# Patient Record
Sex: Male | Born: 1990 | Race: White | Hispanic: No | Marital: Single | Smoking: Former smoker
Health system: Southern US, Community
[De-identification: ages and names within clinical notes are randomized; demographics above are authoritative.]

---
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
## 2014-02-04 ENCOUNTER — Emergency Department (HOSPITAL_COMMUNITY)
Admission: EM | Admit: 2014-02-04 | Discharge: 2014-02-04 | Disposition: A | Discharge status: Home / Self Care | Payer: Managed Care, Other (non HMO) | Attending: Emergency Medicine | Admitting: Emergency Medicine

## 2014-02-04 ENCOUNTER — Encounter (HOSPITAL_COMMUNITY): Payer: Self-pay | Admitting: Emergency Medicine

## 2014-02-04 DIAGNOSIS — R519 Headache, unspecified: Secondary | ICD-10-CM

## 2014-02-04 DIAGNOSIS — R51 Headache: Secondary | ICD-10-CM | POA: Insufficient documentation

## 2014-02-04 DIAGNOSIS — F17200 Nicotine dependence, unspecified, uncomplicated: Secondary | ICD-10-CM | POA: Insufficient documentation

## 2014-02-04 NOTE — ED Notes (Signed)
Pt c/o sharp intermittent pain to right temple x 3 days.

## 2014-02-04 NOTE — Discharge Instructions (Signed)
Take advil (ibuprofen) EITHER 600 mg 4 times a day OR 800 mg 3 times a day. Recheck if you get a constant headache lasting more than 30 minutes or if you get changes in your eye sight, uncontrolled vomiting, numbness of weakness in your extremities or you seem worse with an problems listed on the head injury sheet. . Once you are feeling better have your mothers doctor recheck your blood pressure.

## 2014-02-04 NOTE — ED Provider Notes (Signed)
CSN: [id number]     Arrival date & time 02/04/14  1636 History  This chart was scribed for Ward GivensIva L Daymon Hora, MD by Ardelia Memsylan Malpass, ED Scribe. This patient was seen in room [facility]16A/[facility]16A and the patient's care was started at 8:47 PM.   Chief Complaint  Patient presents with  . Headache    The history is provided by the patient and a parent. No language interpreter was used.    HPI Comments: Andrew Bowers is a 23 y.o. male who presents to the Emergency Department complaining of intermittent, sharp, jabbing pains over his right temple over the past 3 days. Pt states that the episodes occur 1-2 times every 10 minutes and only last a second.  Pt states that head/scalp is not sore to touch, but he states that movement of head worsens headache. He however can sleep laying on his right side.  He denies any head injuries to have onset his pain. 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 He states that he took 2 Advil pills, which helped ease the pain for about an hour. Mother states they have gas hear but  there is a carbon monoxide detector in the home, which hasn't gone off and that no one else in the home has had a headache. Pt denies fever, nausea, vomiting, change in vision, tingling/numbness in arms or legs or neck pain. Pt smokes about 1 pack a day, sometimes less. He denies drinking. He admits to being stressed from work (3rd shift) recently, as he had had increased duties (states they are doing the duties of 2 workers b/o increased orders.  MOP has his of headaches related to BP and sinuses.   PCP- None   History reviewed. No pertinent past medical history. History reviewed. No pertinent past surgical history. No family history on file. History  Substance Use Topics  . Smoking status: Current Every Day Smoker -- 1.00 packs/day    Types: Cigarettes  . Smokeless tobacco: Not on file  . Alcohol Use: No  smoke 1 pack/day or less, denies drinking Pt works in a  factory  Review of Systems  Eyes: Negative for visual disturbance.  Gastrointestinal: Negative for nausea and vomiting.  Musculoskeletal: Negative for neck pain.  Neurological: Positive for headaches. Negative for numbness.  All other systems reviewed and are negative.   Allergies  Review of patient's allergies indicates no known allergies.  Home Medications   Current Outpatient Rx  Name  Route  Sig  Dispense  Refill  . ibuprofen (ADVIL,MOTRIN) 200 MG tablet   Oral   Take 600 mg by mouth every 6 (six) hours as needed for headache.          Triage Vitals: BP 148/93  Pulse 82  Temp(Src) 98.8 F (37.1 C)  Resp 18  Ht 5\' 9"  (1.753 m)  Wt 185 lb 1 oz (83.944 kg)  BMI 27.32 kg/m2  SpO2 98%  Vital signs normal    Physical Exam  Nursing note and vitals reviewed. Constitutional: He is oriented to person, place, and time. He appears well-developed and well-nourished.  Non-toxic appearance. He does not appear ill. No distress.  HENT:  Head: Normocephalic and atraumatic.  Right Ear: External ear normal.  Left Ear: External ear normal.  Nose: Nose normal. No mucosal edema or rhinorrhea.  Mouth/Throat: Oropharynx is clear and moist and mucous membranes are normal. No dental abscesses or uvula swelling.  Scalp non-tender.  Eyes: Conjunctivae and  EOM are normal. Pupils are equal, round, and reactive to light.  Neck: Normal range of motion and full passive range of motion without pain. Neck supple.  Neck non-tender.  Cardiovascular: Normal rate, regular rhythm and normal heart sounds.  Exam reveals no gallop and no friction rub.   No murmur heard. Pulmonary/Chest: Effort normal and breath sounds normal. No respiratory distress. He has no wheezes. He has no rhonchi. He has no rales. He exhibits no tenderness and no crepitus.  Abdominal: Soft. Normal appearance and bowel sounds are normal. He exhibits no distension. There is no tenderness. There is no rebound and no guarding.   Musculoskeletal: Normal range of motion. He exhibits no edema and no tenderness.  Moves all extremities well.   Neurological: He is alert and oriented to person, place, and time. He has normal strength. No cranial nerve deficit.  Grips equal. Motor strength intact. No loss of radial, medial or ulnar nerves.  Skin: Skin is warm, dry and intact. No rash noted. No erythema. No pallor.  Psychiatric: He has a normal mood and affect. His speech is normal and behavior is normal. His mood appears not anxious.    ED Course  Procedures (including critical care time)  DIAGNOSTIC STUDIES: Oxygen Saturation is 98% on RA, normal by my interpretation.    COORDINATION OF CARE: 9:20 PM- Advised pt to Increase Advil dosage. Discussed return precautions. Pt advised of plan for treatment and pt agrees. We discussed to be rechecked if is has a constant persistent headache.  Labs Review Labs Reviewed - No data to display Imaging Review No results found.  EKG Interpretation   None       MDM   Final diagnoses:  Right temporal headache    Plan discharge   Devoria Albe, MD, FACEP   I personally performed the services described in this documentation, which was scribed in my presence. The recorded information has been reviewed and considered.  Devoria Albe, MD, FACEP   Ward Givens, MD 02/05/14 [phone]

## 2017-03-29 ENCOUNTER — Encounter (HOSPITAL_COMMUNITY): Payer: Self-pay

## 2017-03-29 ENCOUNTER — Emergency Department (HOSPITAL_COMMUNITY)
Admission: EM | Admit: 2017-03-29 | Discharge: 2017-03-29 | Disposition: A | Discharge status: Home / Self Care | Payer: BLUE CROSS/BLUE SHIELD | Attending: Emergency Medicine | Admitting: Emergency Medicine

## 2017-03-29 DIAGNOSIS — Z711 Person with feared health complaint in whom no diagnosis is made: Secondary | ICD-10-CM

## 2017-03-29 DIAGNOSIS — Z87891 Personal history of nicotine dependence: Secondary | ICD-10-CM | POA: Diagnosis not present

## 2017-03-29 DIAGNOSIS — Z202 Contact with and (suspected) exposure to infections with a predominantly sexual mode of transmission: Secondary | ICD-10-CM | POA: Insufficient documentation

## 2017-03-29 MED ORDER — CEFTRIAXONE SODIUM 250 MG IJ SOLR
250.0000 mg | Freq: Once | INTRAMUSCULAR | Status: AC
  Administered 2017-03-29: 250 mg via INTRAMUSCULAR
  Filled 2017-03-29: qty 250

## 2017-03-29 MED ORDER — LIDOCAINE HCL (PF) 1 % IJ SOLN
INTRAMUSCULAR | Status: AC
  Administered 2017-03-29: 0.9 mL
  Filled 2017-03-29: qty 5

## 2017-03-29 MED ORDER — AZITHROMYCIN 250 MG PO TABS
1000.0000 mg | ORAL_TABLET | Freq: Once | ORAL | Status: AC
  Administered 2017-03-29: 1000 mg via ORAL
  Filled 2017-03-29: qty 4

## 2017-03-29 NOTE — ED Triage Notes (Addendum)
Pts girlfriend was notified today that she has chlamydia . Pt told girlfriend that he had been cheating on her with a girl from dating site. Denies any discharge or burning with urination

## 2017-03-29 NOTE — ED Notes (Signed)
Pt states understanding of d/c instructions and follow up care. Denies further questions at this time.

## 2017-03-29 NOTE — ED Provider Notes (Signed)
[facility]PT Provider Note   CSN: [id number] Arrival date & time: 03/29/17  1724     History   Chief Complaint Chief Complaint  Patient presents with  . SEXUALLY TRANSMITTED DISEASE    HPI Andrew Bowers is a 26 y.o. male.  Andrew Bowers is a 26 y.o. Male who presents to the ED with concern about exposure to STD. Patient reports his girlfriend was recently diagnosed with chlamydia. He does admit to cheating on the girlfriend recently. He denies any symptoms of an STD. He denies any history of previous STDs. He admits to having unprotected intercourse. He denies penile pain, penile discharge, penile itching, fevers, testicular pain, abdominal pain, nausea, vomiting or rashes.   The history is provided by the patient and medical records. No language interpreter was used.    History reviewed. No pertinent past medical history.  There are no active problems to display for this patient.   History reviewed. No pertinent surgical history.     Home Medications    Prior to Admission medications   Medication Sig Start Date End Date Taking? Authorizing Provider  ibuprofen (ADVIL,MOTRIN) 200 MG tablet Take 600 mg by mouth every 6 (six) hours as needed for headache.    Historical Provider, MD    Family History No family history on file.  Social History Social History  Substance Use Topics  . Smoking status: Former Smoker    Packs/day: 1.00    Types: Cigarettes  . Smokeless tobacco: Never Used  . Alcohol use No     Allergies   Patient has no known allergies.   Review of Systems Review of Systems  Constitutional: Negative for fever.  HENT: Negative for mouth sores.   Gastrointestinal: Negative for abdominal pain, nausea and vomiting.  Genitourinary: Negative for discharge, dysuria, flank pain, frequency, genital sores, penile pain, testicular pain and urgency.  Skin: Negative for rash.     Physical Exam Updated Vital Signs BP (!) 163/92 (BP Location: Right  Arm)   Pulse 94   Temp 97.9 F (36.6 C) (Oral)   Resp 18   Ht  (1.778 m)   Wt 81.6 kg   SpO2 100%   BMI 25.83 kg/m   Physical Exam  Constitutional: He appears well-developed and well-nourished. No distress.  HENT:  Head: Normocephalic and atraumatic.  Mouth/Throat: Oropharynx is clear and moist.  Eyes: Right eye exhibits no discharge. Left eye exhibits no discharge.  Cardiovascular: Normal rate, regular rhythm and intact distal pulses.   Pulmonary/Chest: Effort normal. No respiratory distress.  Abdominal: Soft. There is no tenderness. There is no guarding.  Genitourinary: Penis normal. No penile tenderness.  Genitourinary Comments: GU exam with male RN chaperone. Penis is circumcised. No penile discharge. No testicular or penile tenderness to palpation. No GU rashes.  Neurological: He is alert. Coordination normal.  Skin: Skin is warm and dry. Capillary refill takes less than 2 seconds. No rash noted. He is not diaphoretic. No erythema. No pallor.  Psychiatric: He has a normal mood and affect. His behavior is normal.  Nursing note and vitals reviewed.    ED Treatments / Results  Labs (all labs ordered are listed, but only abnormal results are displayed) Labs Reviewed  GC/CHLAMYDIA PROBE AMP ([place]) NOT AT [facility]    EKG  EKG Interpretation None       Radiology No results found.  Procedures Procedures (including critical care time)  Medications Ordered in ED Medications  cefTRIAXone (ROCEPHIN) injection 250 mg (250  mg Intramuscular Given 03/29/17 1815)  azithromycin (ZITHROMAX) tablet 1,000 mg (1,000 mg Oral Given 03/29/17 1814)  lidocaine (PF) (XYLOCAINE) 1 % injection (0.9 mLs  Given 03/29/17 1815)     Initial Impression / Assessment and Plan / ED Course  I have reviewed the triage vital signs and the nursing notes.  Pertinent labs & imaging results that were available during my care of the patient were reviewed by me and considered in my medical  decision making (see chart for details).     This is a 26 y.o. Male who presents to the ED with concern about exposure to STD. Patient reports his girlfriend was recently diagnosed with chlamydia. He does admit to cheating on the girlfriend recently. He denies any symptoms of an STD. He denies any history of previous STDs. He admits to having unprotected intercourse.  On exam patient is afebrile nontoxic appearing. His abdomen is soft and nontender to palpation. No GU rashes noted. No penile discharge noted. Gonorrhea and Chlamydia testing pending. Patient reports he will decline HIV and syphilis testing at this time and will do this at the health department. We'll treat for gonorrhea and chlamydia here today with Rocephin and azithromycin. I discussed safe sex practices with the patient. I encouraged him to follow up with health department. I advised the patient to follow-up with their primary care provider this week. I advised the patient to return to the emergency department with new or worsening symptoms or new concerns. The patient verbalized understanding and agreement with plan.      Final Clinical Impressions(s) / ED Diagnoses   Final diagnoses:  Exposure to sexually transmitted disease (STD)  Concern about STD in male without diagnosis    New Prescriptions New Prescriptions   No medications on file     Everlene Farrier, PA-C 03/29/17 1825    Eber Hong, MD 03/31/17 [phone]

## 2017-03-30 LAB — GC/CHLAMYDIA PROBE AMP (~~LOC~~) NOT AT ARMC
CHLAMYDIA, DNA PROBE: NEGATIVE
NEISSERIA GONORRHEA: NEGATIVE

## 2017-09-06 ENCOUNTER — Emergency Department (HOSPITAL_COMMUNITY): Payer: Self-pay

## 2017-09-06 ENCOUNTER — Encounter (HOSPITAL_COMMUNITY): Payer: Self-pay | Admitting: *Deleted

## 2017-09-06 ENCOUNTER — Emergency Department (HOSPITAL_COMMUNITY)
Admission: EM | Admit: 2017-09-06 | Discharge: 2017-09-06 | Disposition: A | Discharge status: Home / Self Care | Payer: Self-pay | Attending: Emergency Medicine | Admitting: Emergency Medicine

## 2017-09-06 DIAGNOSIS — Z87891 Personal history of nicotine dependence: Secondary | ICD-10-CM | POA: Insufficient documentation

## 2017-09-06 DIAGNOSIS — R1084 Generalized abdominal pain: Secondary | ICD-10-CM | POA: Insufficient documentation

## 2017-09-06 DIAGNOSIS — R112 Nausea with vomiting, unspecified: Secondary | ICD-10-CM | POA: Insufficient documentation

## 2017-09-06 DIAGNOSIS — R197 Diarrhea, unspecified: Secondary | ICD-10-CM | POA: Insufficient documentation

## 2017-09-06 LAB — COMPREHENSIVE METABOLIC PANEL
ALT: 33 U/L (ref 17–63)
AST: 19 U/L (ref 15–41)
Albumin: 4.9 g/dL (ref 3.5–5.0)
Alkaline Phosphatase: 43 U/L (ref 38–126)
Anion gap: 12 (ref 5–15)
BUN: 14 mg/dL (ref 6–20)
CO2: 26 mmol/L (ref 22–32)
Calcium: 10 mg/dL (ref 8.9–10.3)
Chloride: 102 mmol/L (ref 101–111)
Creatinine, Ser: 1.07 mg/dL (ref 0.61–1.24)
GFR calc Af Amer: >60 mL/min (ref >60)
GFR calc non Af Amer: >60 mL/min (ref >60)
Glucose, Bld: 106 mg/dL — ABNORMAL HIGH (ref 65–99)
Potassium: 3.8 mmol/L (ref 3.5–5.1)
Sodium: 140 mmol/L (ref 135–145)
Total Bilirubin: 1.2 mg/dL (ref 0.3–1.2)
Total Protein: 8.5 g/dL — ABNORMAL HIGH (ref 6.5–8.1)

## 2017-09-06 LAB — URINALYSIS, ROUTINE W REFLEX MICROSCOPIC
Bilirubin Urine: NEGATIVE
Glucose, UA: NEGATIVE mg/dL
Hgb urine dipstick: NEGATIVE
Ketones, ur: 80 mg/dL — AB
Leukocytes, UA: NEGATIVE
Nitrite: NEGATIVE
Protein, ur: NEGATIVE mg/dL
Specific Gravity, Urine: 1.026 (ref 1.005–1.030)
pH: 5 (ref 5.0–8.0)

## 2017-09-06 LAB — CBC
HCT: 46.7 % (ref 39.0–52.0)
Hemoglobin: 16.3 g/dL (ref 13.0–17.0)
MCH: 31 pg (ref 26.0–34.0)
MCHC: 34.9 g/dL (ref 30.0–36.0)
MCV: 89 fL (ref 78.0–100.0)
Platelets: 225 10*3/uL (ref 150–400)
RBC: 5.25 MIL/uL (ref 4.22–5.81)
RDW: 12.3 % (ref 11.5–15.5)
WBC: 17.3 10*3/uL — ABNORMAL HIGH (ref 4.0–10.5)

## 2017-09-06 LAB — LIPASE, BLOOD: Lipase: 21 U/L (ref 11–51)

## 2017-09-06 MED ORDER — IOPAMIDOL (ISOVUE-300) INJECTION 61%
100.0000 mL | Freq: Once | INTRAVENOUS | Status: AC | PRN
  Administered 2017-09-06: 100 mL via INTRAVENOUS

## 2017-09-06 MED ORDER — SODIUM CHLORIDE 0.9 % IV BOLUS (SEPSIS)
1000.0000 mL | Freq: Once | INTRAVENOUS | Status: AC
  Administered 2017-09-06: 1000 mL via INTRAVENOUS

## 2017-09-06 MED ORDER — LOPERAMIDE HCL 2 MG PO CAPS: 2.0000 mg | ORAL_CAPSULE | Freq: Four times a day (QID) | ORAL | 0 refills | Status: AC | PRN

## 2017-09-06 MED ORDER — ONDANSETRON 4 MG PO TBDP: ORAL_TABLET | ORAL | 0 refills | Status: AC

## 2017-09-06 NOTE — ED Triage Notes (Addendum)
Pt c/o mid abdominal pain, vomiting, diarrhea, fever x 2 weeks.   Pt also requesting STD check. Denies penile drainage at this time.

## 2017-09-06 NOTE — ED Provider Notes (Signed)
[facility] Provider Note   CSN: [id number] Arrival date & time: 09/06/17  1201     History   Chief Complaint Chief Complaint  Patient presents with  . Abdominal Pain    HPI Andrew Bowers is a 26 y.o. male.  Patient reports diffuse abdominal pain of 2-3 weeks duration, with frequent episodes of diarrhea and vomiting. No identified trigger.   The history is provided by the patient. No language interpreter was used.  Abdominal Pain   This is a new problem. The current episode started more than 1 week ago. The problem occurs daily. The problem has been gradually worsening. The pain is associated with an unknown factor. The pain is located in the generalized abdominal region. The pain is moderate. Associated symptoms include fever, diarrhea, nausea, vomiting and headaches.    History reviewed. No pertinent past medical history.  There are no active problems to display for this patient.   History reviewed. No pertinent surgical history.     Home Medications    Prior to Admission medications   Medication Sig Start Date End Date Taking? Authorizing Provider  ibuprofen (ADVIL,MOTRIN) 200 MG tablet Take 600 mg by mouth every 6 (six) hours as needed for headache.    [provider]    Family History No family history on file.  Social History Social History  Substance Use Topics  . Smoking status: Former Smoker    Packs/day: 1.00    Types: Cigarettes  . Smokeless tobacco: Never Used  . Alcohol use No     Allergies   Patient has no known allergies.   Review of Systems Review of Systems  Constitutional: Positive for fever.  Gastrointestinal: Positive for abdominal pain, diarrhea, nausea and vomiting.  Genitourinary: Positive for decreased urine volume and difficulty urinating. Negative for discharge and testicular pain.  Neurological: Positive for headaches.  All other systems reviewed and are negative.    Physical Exam Updated Vital  Signs BP (!) 157/96 (BP Location: Right Arm)   Pulse 90   Temp 98.5 F (36.9 C) (Oral)   Resp 16   Ht  (1.753 m)   Wt 76.7 kg (169 lb 1.6 oz)   SpO2 100%   BMI 24.97 kg/m   Physical Exam  Constitutional: He is oriented to person, place, and time. He appears well-developed and well-nourished.  HENT:  Head: Normocephalic.  Eyes: Conjunctivae are normal.  Neck: Neck supple.  Cardiovascular: Normal rate and regular rhythm.   Pulmonary/Chest: Effort normal and breath sounds normal.  Abdominal: Soft. He exhibits no distension. There is no rebound and no guarding. Hernia confirmed negative in the right inguinal area and confirmed negative in the left inguinal area.  Genitourinary: Testes normal and penis normal. Circumcised.  Musculoskeletal: He exhibits no edema or tenderness.  Neurological: He is alert and oriented to person, place, and time.  Skin: Skin is warm and dry.  Psychiatric: He has a normal mood and affect.  Nursing note and vitals reviewed.    ED Treatments / Results  Labs (all labs ordered are listed, but only abnormal results are displayed) Labs Reviewed  COMPREHENSIVE METABOLIC PANEL - Abnormal; Notable for the following:       Result Value   Glucose, Bld 106 (*)    Total Protein 8.5 (*)    All other components within normal limits  CBC - Abnormal; Notable for the following:    WBC 17.3 (*)    All other components within normal limits  URINALYSIS,  ROUTINE W REFLEX MICROSCOPIC - Abnormal; Notable for the following:    APPearance HAZY (*)    Ketones, ur 80 (*)    All other components within normal limits  LIPASE, BLOOD    EKG  EKG Interpretation None       Radiology Ct Abdomen Pelvis W Contrast  Result Date: 09/06/2017 CLINICAL DATA:  ISOVUE 300 100 ml @ 3.0 60 sec delay Pt c/o mid intermittent sharp abdominal pain, vomiting, diarrhea, fever x 2 weeks. EXAM: CT ABDOMEN AND PELVIS WITH CONTRAST TECHNIQUE: Multidetector CT imaging of the abdomen  and pelvis was performed using the standard protocol following bolus administration of intravenous contrast. CONTRAST:  ISOVUE-300 IOPAMIDOL (ISOVUE-300) INJECTION 61% COMPARISON:  05/20/2005 plain films FINDINGS: Lower chest: No acute abnormality. Hepatobiliary: No focal liver abnormality is seen. No gallstones, gallbladder wall thickening, or biliary dilatation. Pancreas: Unremarkable. No pancreatic ductal dilatation or surrounding inflammatory changes. Spleen: Normal in size without focal abnormality. Adrenals/Urinary Tract: Normal adrenal glands. There is symmetric enhancement of both kidneys. No evidence for urinary tract obstruction. No renal mass. Urinary bladder is normal in appearance. Stomach/Bowel: The stomach and small bowel loops are normal in appearance. The appendix is well seen and has a normal appearance. Loops of colon are normal in appearance. Vascular/Lymphatic: There is normal vascular opacification of the celiac axis, superior mesenteric artery, and inferior mesenteric artery. Normal appearance of the portal venous system and inferior vena cava. No significant retroperitoneal adenopathy. There are mildly prominent lymph nodes within the right lower quadrant mesenteric, largest measuring 1.7 x 1.0 cm on coronal image 42 of series 5. Reproductive: Prostate is unremarkable. Other: No free pelvic fluid. Anterior abdominal wall is unremarkable in appearance. Musculoskeletal: No acute or significant osseous findings. IMPRESSION: 1. Mildly prominent right lower quadrant mesenteric lymph nodes consistent with mesenteric adenitis. 2. Normal appearance of the appendix. 3. No bowel obstruction or urinary tract obstruction. Electronically Signed   By: Norva Pavlov M.D.   On: 09/06/2017 18:24    Procedures Procedures (including critical care time)  Medications Ordered in ED Medications - No data to display   Initial Impression / Assessment and Plan / ED Course  I have reviewed the  triage vital signs and the nursing notes.  Pertinent labs & imaging results that were available during my care of the patient were reviewed by me and considered in my medical decision making (see chart for details).  Clinical Course as of Sep 06 1637  Tue Sep 06, 2017  1516 WBC: (!) 17.3 [SF]    Clinical Course User Index [SF] Durward Fortes, [place]    Patient is nontoxic, nonseptic appearing, in no apparent distress.  Patient's pain and other symptoms adequately managed in emergency department.  Fluid bolus given.  Labs, imaging and vitals reviewed.  Patient does not meet the SIRS or Sepsis criteria.  On repeat exam patient does not have a surgical abdomen and there are no peritoneal signs.  No indication of appendicitis, bowel obstruction, bowel perforation, cholecystitis, diverticulitis. Patient discharged home with symptomatic treatment and given strict instructions for follow-up with their primary care physician.  I have also discussed reasons to return immediately to the ER.  Patient to collect stool sample for culture and return sample for testing. Patient expresses understanding and agrees with plan.    Final Clinical Impressions(s) / ED Diagnoses   Final diagnoses:  Generalized abdominal pain  Nausea vomiting and diarrhea    New Prescriptions Discharge Medication List as of 09/06/2017  7:02 PM    START taking these medications   Details  loperamide (IMODIUM) 2 MG capsule Take 1 capsule (2 mg total) by mouth 4 (four) times daily as needed for diarrhea or loose stools., Starting Tue 09/06/2017, Print    ondansetron (ZOFRAN ODT) 4 MG disintegrating tablet  ODT q4 hours prn nausea/vomit, Print         Felicie Morn, NP 09/06/17 2354    Raeford Razor, MD 09/09/17 1441

## 2017-09-07 ENCOUNTER — Other Ambulatory Visit (HOSPITAL_COMMUNITY)
Admission: RE | Admit: 2017-09-07 | Discharge: 2017-09-07 | Disposition: A | Discharge status: Home / Self Care | Payer: Self-pay | Source: Ambulatory Visit | Attending: Nurse Practitioner | Admitting: Nurse Practitioner

## 2017-09-07 LAB — C DIFFICILE QUICK SCREEN W PCR REFLEX
C DIFFICILE (CDIFF) TOXIN: POSITIVE — AB
C Diff antigen: POSITIVE — AB
C Diff interpretation: DETECTED

## 2017-09-07 NOTE — ED Notes (Signed)
CRITICAL VALUE ALERT  Critical Value:  c diff positive  Date & Time Notied:  09/07/2017  Provider Notified: Dr. Clayborne Dana  Orders Received/Actions taken: Prescription for Flagyl  po bid x 2 weeks called in to [facility] pharmacy.  Instructed pt to return here as needed and follow up with pcp.  Pt verbalized understanding of instructions.

## 2017-09-13 LAB — STOOL CULTURE: E COLI SHIGA TOXIN ASSAY: NEGATIVE

## 2017-09-13 LAB — STOOL CULTURE REFLEX - RSASHR

## 2017-09-13 LAB — STOOL CULTURE REFLEX - CMPCXR

## 2017-09-14 LAB — OVA + PARASITE EXAM

## 2017-09-14 LAB — O&P RESULT

## 2017-09-27 NOTE — Progress Notes (Signed)
Patient stool culture positive for c-diff. Results reviewed by Dr. Clayborne Dana, prescription for flagyl called in to patient's pharmacy.

## 2018-02-20 ENCOUNTER — Encounter: Payer: Self-pay | Admitting: Gastroenterology

## 2018-03-07 ENCOUNTER — Other Ambulatory Visit: Payer: Self-pay

## 2018-03-07 ENCOUNTER — Encounter (HOSPITAL_COMMUNITY): Payer: Self-pay | Admitting: Emergency Medicine

## 2018-03-07 ENCOUNTER — Emergency Department (HOSPITAL_COMMUNITY)
Admission: EM | Admit: 2018-03-07 | Discharge: 2018-03-07 | Disposition: A | Discharge status: Home / Self Care | Payer: Self-pay | Attending: Emergency Medicine | Admitting: Emergency Medicine

## 2018-03-07 DIAGNOSIS — L259 Unspecified contact dermatitis, unspecified cause: Secondary | ICD-10-CM

## 2018-03-07 DIAGNOSIS — L237 Allergic contact dermatitis due to plants, except food: Secondary | ICD-10-CM | POA: Insufficient documentation

## 2018-03-07 DIAGNOSIS — Z87891 Personal history of nicotine dependence: Secondary | ICD-10-CM | POA: Insufficient documentation

## 2018-03-07 DIAGNOSIS — R300 Dysuria: Secondary | ICD-10-CM | POA: Insufficient documentation

## 2018-03-07 LAB — URINALYSIS, ROUTINE W REFLEX MICROSCOPIC
BILIRUBIN URINE: NEGATIVE
Glucose, UA: NEGATIVE mg/dL
HGB URINE DIPSTICK: NEGATIVE
KETONES UR: NEGATIVE mg/dL
Leukocytes, UA: NEGATIVE
NITRITE: NEGATIVE
PROTEIN: NEGATIVE mg/dL
Specific Gravity, Urine: 1.018 (ref 1.005–1.030)
pH: 6 (ref 5.0–8.0)

## 2018-03-07 MED ORDER — FAMOTIDINE 20 MG PO TABS
20.0000 mg | ORAL_TABLET | Freq: Once | ORAL | Status: AC
  Administered 2018-03-07: 20 mg via ORAL
  Filled 2018-03-07: qty 1

## 2018-03-07 MED ORDER — DEXAMETHASONE SODIUM PHOSPHATE 10 MG/ML IJ SOLN
10.0000 mg | Freq: Once | INTRAMUSCULAR | Status: AC
  Administered 2018-03-07: 10 mg via INTRAMUSCULAR
  Filled 2018-03-07: qty 1

## 2018-03-07 MED ORDER — FAMOTIDINE 20 MG PO TABS: 20.0000 mg | ORAL_TABLET | Freq: Two times a day (BID) | ORAL | 0 refills | Status: AC

## 2018-03-07 MED ORDER — PREDNISONE 20 MG PO TABS: ORAL_TABLET | ORAL | 0 refills | Status: AC

## 2018-03-07 NOTE — ED Triage Notes (Signed)
Pt wants to be tested for stds due to having unprotected sex. He c/o burning pain during urination and rash to the left arm.

## 2018-03-07 NOTE — Discharge Instructions (Addendum)
Stay cool, heat will make the rash itch more. Take the medications as prescribed. You can also take benadryl 25-50 mg every 6 hrs as needed for itching (it may make your sleepy) OR try zyrtec OTC once a day for the itching. You can use calamine lotion on the rash.  You will be called if your STD tests are positive. USE CONDOMS!!!

## 2018-03-07 NOTE — ED Provider Notes (Signed)
[facility] Provider Note   CSN: [id number] Arrival date & time: 03/07/18  0041  Time seen 02:40 AM   History   Chief Complaint Chief Complaint  Patient presents with  . SEXUALLY TRANSMITTED DISEASE    HPI Levin ErpJoshua T Zavadil is a 27 y.o. male.  HPI patient states March 24 he was helping someone do yard work and they were removing a lot of vines, he states some of the findings had a fuzzy covering on them.  This morning he woke up and had a itchy rash mainly on his left upper extremity.  He denies any new detergents, foods, soaps, colognes.  Nothing else is new.  He states he took Benadryl without relief.  Patient also complains of dysuria for a month.  He states is not every time he urinates.  He states he works in a hot environment and sometimes he thinks he gets dehydrated.  He was seen at the urgent care and he was given doxycycline that he took for 10 days and a injection or shot.  He denies any penile drip.  He states he had a new sexual partner however that was 2 weeks ago, after he had the symptoms.  Patient wants to be tested for STDs.  PCP Patient, No Pcp Per      History reviewed. No pertinent past medical history.  There are no active problems to display for this patient.   History reviewed. No pertinent surgical history.      Home Medications    Prior to Admission medications   Medication Sig Start Date End Date Taking? Authorizing Provider  oxymetazoline (AFRIN) 0.05 % nasal spray Place 1 spray into both nostrils 2 (two) times daily as needed for congestion.   Yes [provider]  famotidine (PEPCID) 20 MG tablet Take 1 tablet (20 mg total) by mouth 2 (two) times daily. 03/07/18   Devoria AlbeKnapp, Tyneshia Stivers, MD  ibuprofen (ADVIL,MOTRIN) 200 MG tablet Take 800 mg by mouth every 6 (six) hours as needed for headache.     [provider]  loperamide (IMODIUM) 2 MG capsule Take 1 capsule (2 mg total) by mouth 4 (four) times daily as needed for  diarrhea or loose stools. 09/06/17   Felicie MornSmith, David, NP  ondansetron (ZOFRAN ODT) 4 MG disintegrating tablet 4mg  ODT q4 hours prn nausea/vomit 09/06/17   Felicie MornSmith, David, NP  predniSONE (DELTASONE) 20 MG tablet Take 3 po QD x 3d , then 2 po QD x 3d then 1 po QD x 3d 03/07/18   Devoria AlbeKnapp, Sohaib Vereen, MD    Family History No family history on file.  Social History Social History   Tobacco Use  . Smoking status: Former Smoker    Packs/day: 1.00    Types: Cigarettes  . Smokeless tobacco: Never Used  Substance Use Topics  . Alcohol use: No  . Drug use: Yes    Types: Marijuana    Comment: last used 09/05/17  employed  Allergies   Patient has no known allergies.   Review of Systems Review of Systems  All other systems reviewed and are negative.    Physical Exam Updated Vital Signs BP (!) 148/98   Pulse 77   Temp 98.2 F (36.8 C)   Resp 18   Ht 5\' 10"  (1.778 m)   Wt 75.3 kg (166 lb)   SpO2 99%   BMI 23.82 kg/m   Vital signs normal    Physical Exam  Constitutional: He is oriented to person, place, and  time. He appears well-developed and well-nourished.  HENT:  Head: Normocephalic and atraumatic.  Right Ear: External ear normal.  Left Ear: External ear normal.  Nose: Nose normal.  Eyes: Conjunctivae and EOM are normal.  Neck: Normal range of motion.  Cardiovascular: Normal rate.  Pulmonary/Chest: Effort normal. No respiratory distress.  Musculoskeletal: He exhibits no edema, tenderness or deformity.  Neurological: He is alert and oriented to person, place, and time. No cranial nerve deficit.  Skin: Skin is warm and dry. Rash noted.  Patient has some clustering of red raised rash mainly on the outer aspect of his left upper arm and elbow with a small lesion on his shoulder and a couple on his back that are smaller.  These are consistent with contact dermatitis.  Psychiatric: He has a normal mood and affect. His behavior is normal. Thought content normal.  Nursing note and vitals  reviewed.      ED Treatments / Results  Labs (all labs ordered are listed, but only abnormal results are displayed) Results for orders placed or performed during the hospital encounter of 03/07/18  Urinalysis, Routine w reflex microscopic  Result Value Ref Range   Color, Urine YELLOW YELLOW   APPearance CLEAR CLEAR   Specific Gravity, Urine 1.018 1.005 - 1.030   pH 6.0 5.0 - 8.0   Glucose, UA NEGATIVE NEGATIVE mg/dL   Hgb urine dipstick NEGATIVE NEGATIVE   Bilirubin Urine NEGATIVE NEGATIVE   Ketones, ur NEGATIVE NEGATIVE mg/dL   Protein, ur NEGATIVE NEGATIVE mg/dL   Nitrite NEGATIVE NEGATIVE   Leukocytes, UA NEGATIVE NEGATIVE   Laboratory interpretation all normal     EKG None  Radiology No results found.  Procedures Procedures (including critical care time)  Medications Ordered in ED Medications  dexamethasone (DECADRON) injection 10 mg (10 mg Intramuscular Given 03/07/18 0312)  famotidine (PEPCID) tablet 20 mg (20 mg Oral Given 03/07/18 0311)     Initial Impression / Assessment and Plan / ED Course  I have reviewed the triage vital signs and the nursing notes.  Pertinent labs & imaging results that were available during my care of the patient were reviewed by me and considered in my medical decision making (see chart for details).     Patient's rash is most consistent with contact dermatitis most likely related to the yard work he did a few days ago.  He was given Decadron IM and oral Pepcid.  Patient denies penile drip, so urine was used for testing for STDs.  Patient wants to be tested for syphilis and HIV and RPR and HIV testing was done.   Patient urinalysis is normal.  He was not sent home on antibiotics.  He was advised to be called if any of his STD test are positive.  Final Clinical Impressions(s) / ED Diagnoses   Final diagnoses:  Contact dermatitis, unspecified contact dermatitis type, unspecified trigger  Poison oak dermatitis  Dysuria    ED  Discharge Orders        Ordered    predniSONE (DELTASONE) 20 MG tablet     03/07/18 0426    famotidine (PEPCID) 20 MG tablet  2 times daily     03/07/18 0426    zyrtec or benadryl OTC as needed  Plan discharge  Devoria Albe, MD, Concha Pyo, MD 03/07/18 [phone]

## 2018-03-08 LAB — HIV ANTIBODY (ROUTINE TESTING W REFLEX): HIV SCREEN 4TH GENERATION: NONREACTIVE

## 2018-03-08 LAB — GC/CHLAMYDIA PROBE AMP (~~LOC~~) NOT AT ARMC
CHLAMYDIA, DNA PROBE: NEGATIVE
Neisseria Gonorrhea: NEGATIVE

## 2018-03-08 LAB — SYPHILIS: RPR W/REFLEX TO RPR TITER AND TREPONEMAL ANTIBODIES, TRADITIONAL SCREENING AND DIAGNOSIS ALGORITHM: RPR Ser Ql: NONREACTIVE

## 2018-04-19 ENCOUNTER — Ambulatory Visit: Payer: Self-pay | Admitting: Gastroenterology

## 2018-11-22 IMAGING — CT CT ABD-PELV W/ CM
2 of 3 series · 16 of 46 positions shown, 18 images · IV contrast (Isovue)
Comparison: 05/20/2005 plain films

CLINICAL DATA: ISOVUE 300 100 ml @ 3.0 60 sec delay Pt c/o mid
intermittent sharp abdominal pain, vomiting, diarrhea, fever x 2
weeks.

EXAM:
CT ABDOMEN AND PELVIS WITH CONTRAST
TECHNIQUE: Multidetector CT imaging of the abdomen and pelvis was performed
using the standard protocol following bolus administration of
intravenous contrast.
CONTRAST:  100mL 312DP2-TAA IOPAMIDOL (312DP2-TAA) INJECTION 61%

[Series 2: axial st · axial · 0.68mm/px · z∈[-614,-229]mm · 13 of 89 slices shown, 15 images]
[im 6/89  soft-tissue]
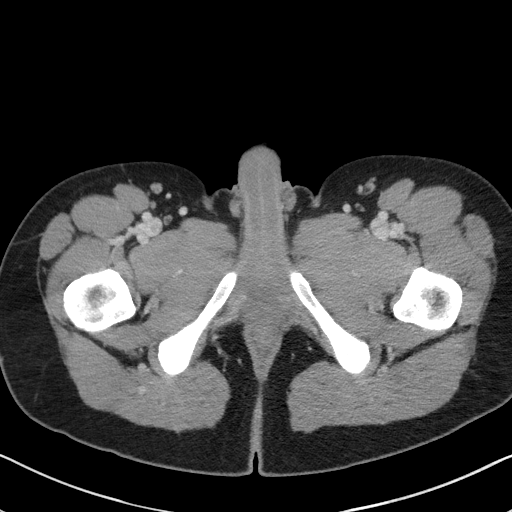
[im 6/89  bone]
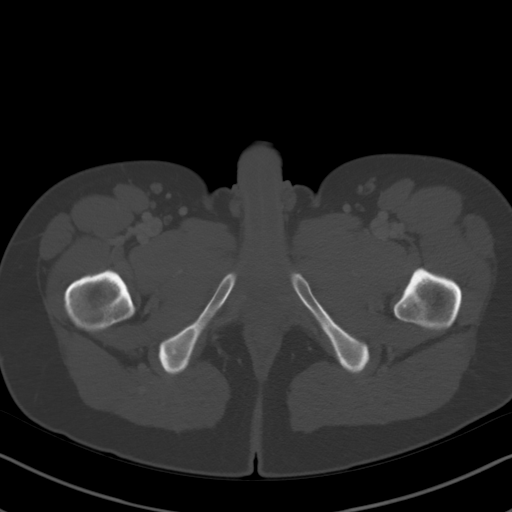
[im 12/89  soft-tissue]
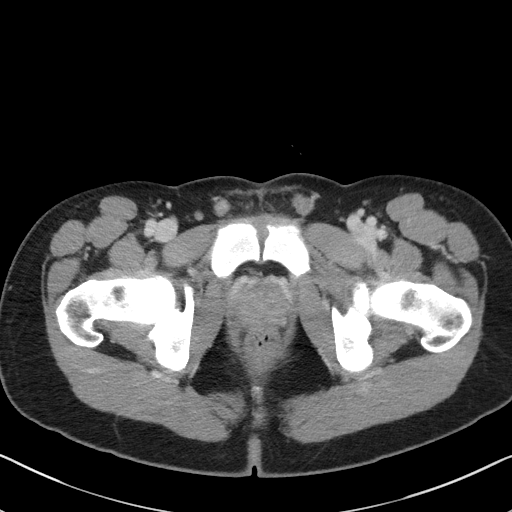
[im 18/89  soft-tissue]
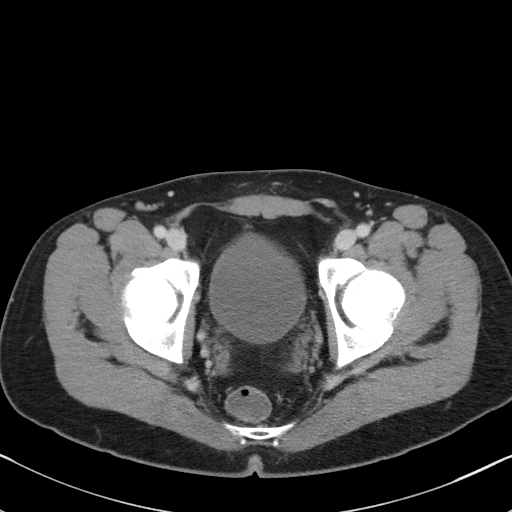
[im 26/89  soft-tissue]
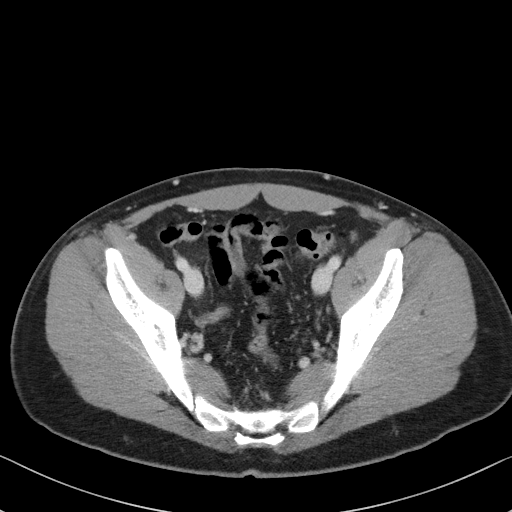
[im 32/89  soft-tissue]
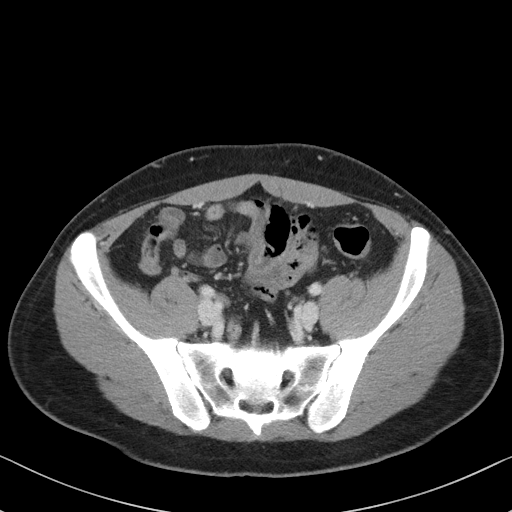
[im 37/89  soft-tissue]
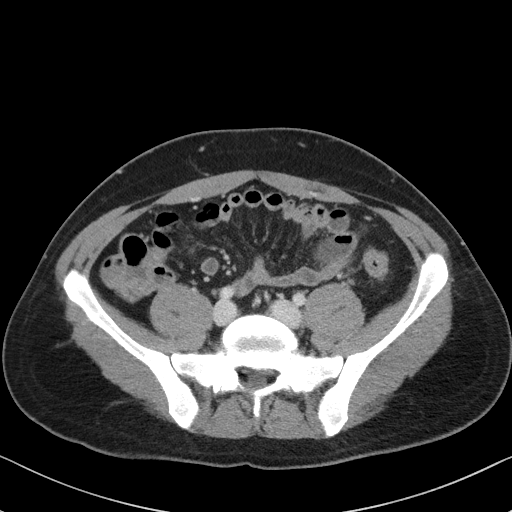
[im 46/89  soft-tissue]
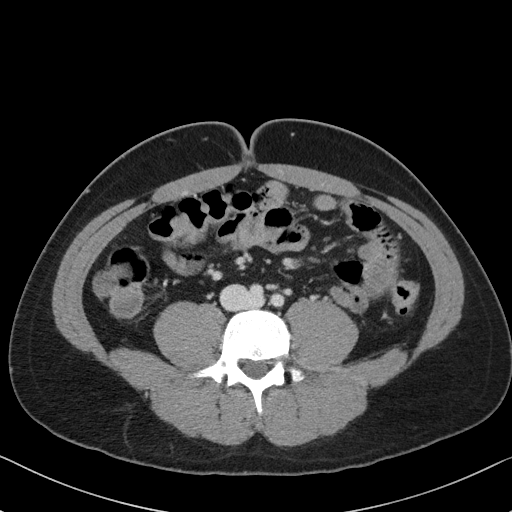
[im 52/89  soft-tissue]
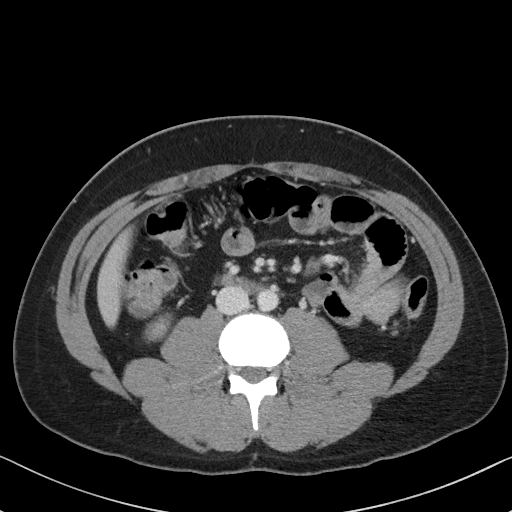
[im 57/89  soft-tissue]
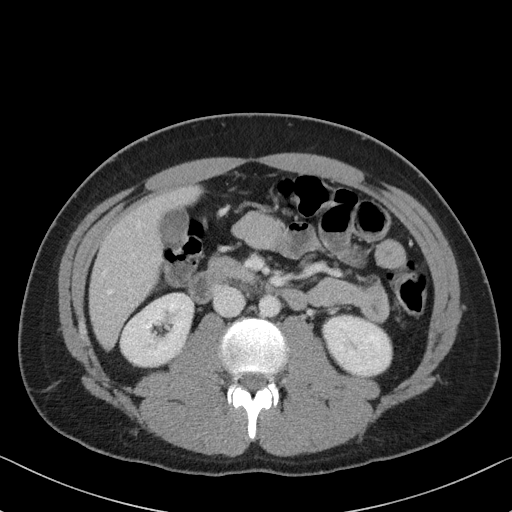
[im 57/89  bone]
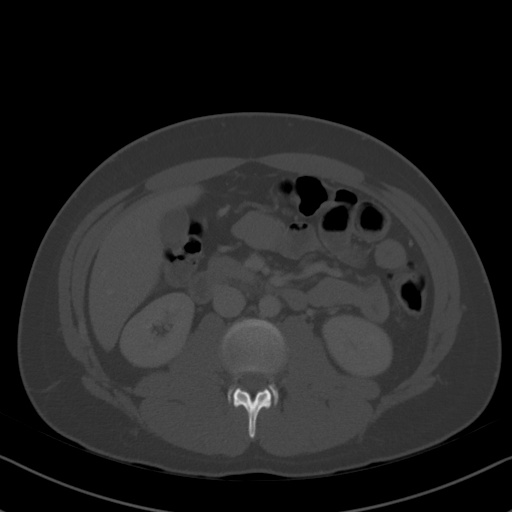
[im 63/89  soft-tissue]
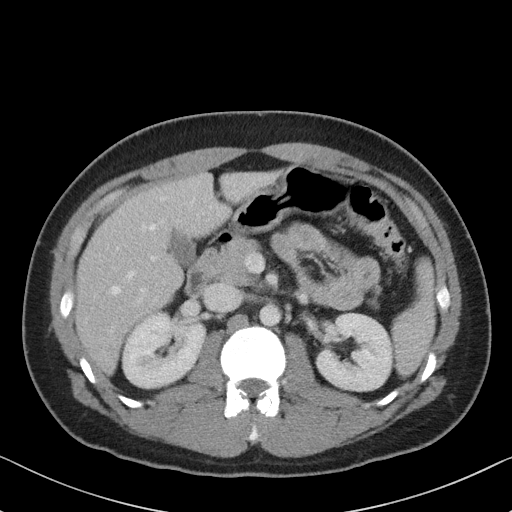
[im 71/89  soft-tissue]
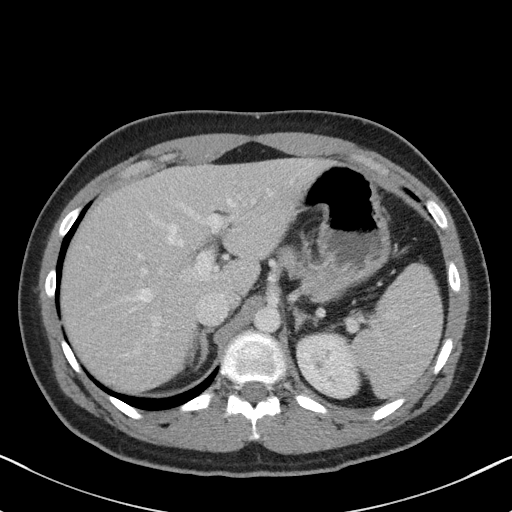
[im 77/89  soft-tissue]
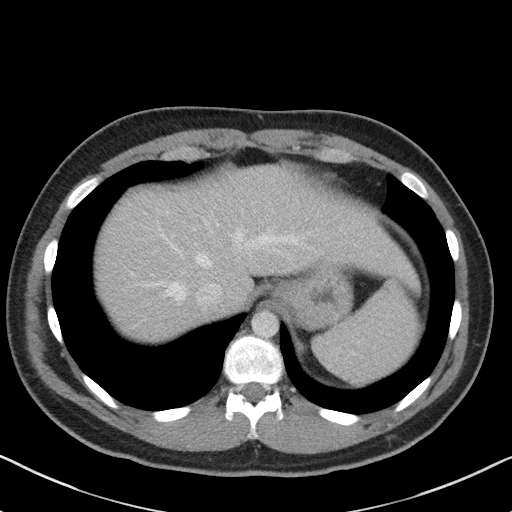
[im 83/89  soft-tissue]
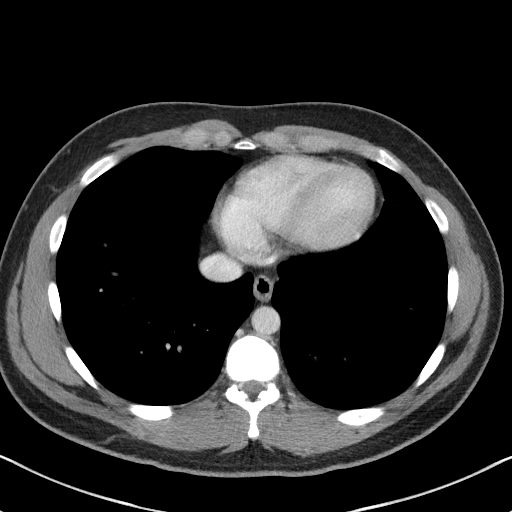

[Series 5: coronal st · coronal · 0.78mm/px · 3 of 92 slices shown]
[im 31/92  soft-tissue]
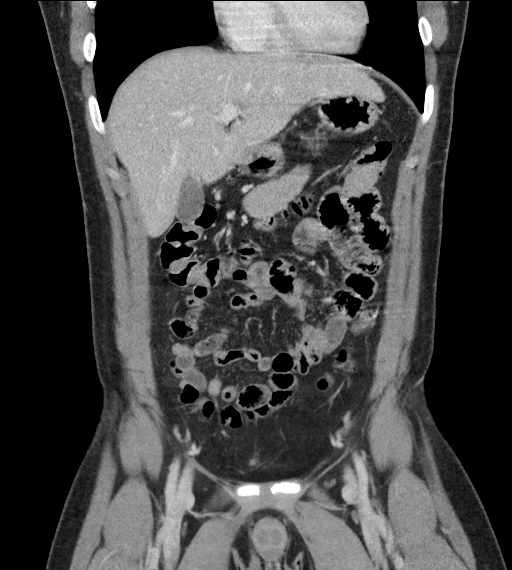
[im 41/92  soft-tissue]
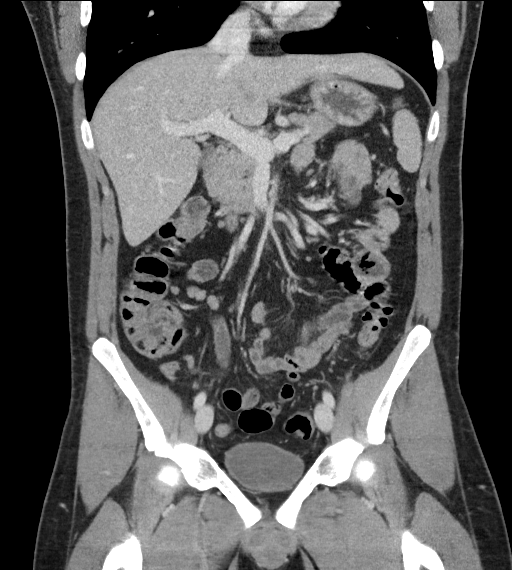
[im 51/92  soft-tissue]
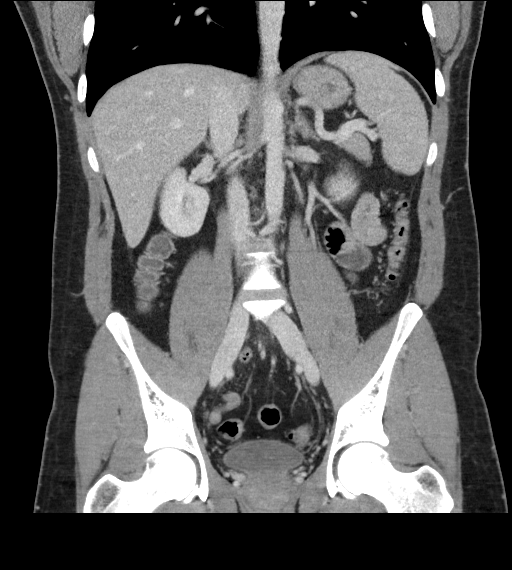

[16 of 46 positions shown; findings below may reference images not displayed]

FINDINGS: Lower chest: No acute abnormality.

Hepatobiliary: No focal liver abnormality is seen. No gallstones,
gallbladder wall thickening, or biliary dilatation.

Pancreas: Unremarkable. No pancreatic ductal dilatation or
surrounding inflammatory changes.

Spleen: Normal in size without focal abnormality.

Adrenals/Urinary Tract: Normal adrenal glands. There is symmetric
enhancement of both kidneys. No evidence for urinary tract
obstruction. No renal mass. Urinary bladder is normal in appearance.

Stomach/Bowel: The stomach and small bowel loops are normal in
appearance. The appendix is well seen and has a normal appearance.
Loops of colon are normal in appearance.

Vascular/Lymphatic: There is normal vascular opacification of the
celiac axis, superior mesenteric artery, and inferior mesenteric
artery. Normal appearance of the portal venous system and inferior
vena cava. No significant retroperitoneal adenopathy. There are
mildly prominent lymph nodes within the right lower quadrant
mesenteric, largest measuring 1.7 x 1.0 cm on coronal image 42 of
series 5.

Reproductive: Prostate is unremarkable.

Other: No free pelvic fluid. Anterior abdominal wall is unremarkable
in appearance.

Musculoskeletal: No acute or significant osseous findings.
IMPRESSION: 1. Mildly prominent right lower quadrant mesenteric lymph nodes
consistent with mesenteric adenitis.
2. Normal appearance of the appendix.
3. No bowel obstruction or urinary tract obstruction.
